# Patient Record
Sex: Male | Born: 1955 | Race: White | Hispanic: No | Marital: Married | Smoking: Never smoker
Health system: Southern US, Community
[De-identification: ages and names within clinical notes are randomized; demographics above are authoritative.]

## PROBLEM LIST (undated history)

## (undated) DIAGNOSIS — F419 Anxiety disorder, unspecified: Secondary | ICD-10-CM

## (undated) HISTORY — PX: HERNIA REPAIR: SHX51

## (undated) HISTORY — PX: COLONOSCOPY: SHX174

## (undated) HISTORY — PX: TONSILLECTOMY: SUR1361

---

## 2003-07-30 ENCOUNTER — Emergency Department (HOSPITAL_COMMUNITY): Admission: EM | Admit: 2003-07-30 | Discharge: 2003-07-30 | Payer: Self-pay | Admitting: Emergency Medicine
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
## 2014-07-17 ENCOUNTER — Encounter (HOSPITAL_COMMUNITY): Payer: Self-pay | Admitting: *Deleted

## 2014-07-17 ENCOUNTER — Ambulatory Visit (HOSPITAL_COMMUNITY)
Admission: EM | Admit: 2014-07-17 | Discharge: 2014-07-17 | Disposition: A | Discharge status: Home / Self Care | Payer: BLUE CROSS/BLUE SHIELD | Source: Ambulatory Visit | Attending: Gastroenterology | Admitting: Gastroenterology

## 2014-07-17 ENCOUNTER — Encounter (HOSPITAL_COMMUNITY)
Admission: EM | Disposition: A | Discharge status: Home / Self Care | Payer: Self-pay | Source: Ambulatory Visit | Attending: Gastroenterology

## 2014-07-17 ENCOUNTER — Other Ambulatory Visit: Payer: Self-pay | Admitting: Gastroenterology

## 2014-07-17 DIAGNOSIS — F419 Anxiety disorder, unspecified: Secondary | ICD-10-CM | POA: Diagnosis not present

## 2014-07-17 DIAGNOSIS — Z1211 Encounter for screening for malignant neoplasm of colon: Secondary | ICD-10-CM | POA: Insufficient documentation

## 2014-07-17 HISTORY — PX: COLONOSCOPY: SHX5424

## 2014-07-17 HISTORY — DX: Anxiety disorder, unspecified: F41.9

## 2014-07-17 SURGERY — COLONOSCOPY
Anesthesia: Moderate Sedation

## 2014-07-17 MED ORDER — DIPHENHYDRAMINE HCL 50 MG/ML IJ SOLN
INTRAMUSCULAR | Status: AC
  Filled 2014-07-17: qty 1

## 2014-07-17 MED ORDER — SODIUM CHLORIDE 0.9 % IV SOLN
INTRAVENOUS | Status: DC
  Administered 2014-07-17: 500 mL via INTRAVENOUS

## 2014-07-17 MED ORDER — FENTANYL CITRATE 0.05 MG/ML IJ SOLN
INTRAMUSCULAR | Status: AC
  Filled 2014-07-17: qty 2

## 2014-07-17 MED ORDER — MIDAZOLAM HCL 5 MG/ML IJ SOLN
INTRAMUSCULAR | Status: AC
  Filled 2014-07-17: qty 2

## 2014-07-17 MED ORDER — FENTANYL CITRATE 0.05 MG/ML IJ SOLN
INTRAMUSCULAR | Status: DC | PRN
  Administered 2014-07-17 (×2): 25 ug via INTRAVENOUS

## 2014-07-17 MED ORDER — MIDAZOLAM HCL 5 MG/5ML IJ SOLN
INTRAMUSCULAR | Status: DC | PRN
  Administered 2014-07-17 (×2): 2 mg via INTRAVENOUS
  Administered 2014-07-17: 1 mg via INTRAVENOUS

## 2014-07-17 NOTE — Op Note (Signed)
[facility] [address] [place], [id number]   COLONOSCOPY PROCEDURE REPORT  PATIENT: Tanner Porter, [name]  MR#: [id number] BIRTHDATE: [birth year] , 58  yrs. old GENDER: male ENDOSCOPIST: Jeani HawkingPatrick Pace Lamadrid, MD REFERRED BY: PROCEDURE DATE:  07/17/2014 PROCEDURE:   Screening ASA CLASS:   1 INDICATIONS:Screening MEDICATIONS: Fentanyl 50 mcg IV and Versed 5 mg IV  DESCRIPTION OF PROCEDURE:   After the risks and benefits and of the procedure were explained, informed consent was obtained.  revealed no abnormalities of the rectum.    The Pentax Ped Colon [id number] endoscope was introduced through the anus and advanced to the cecum, which was identified by both the appendix and ileocecal valve .  The quality of the prep was good. .  The instrument was then slowly withdrawn as the colon was fully examined. Estimated blood loss is zero unless otherwise noted in this procedure report.    FINDINGS: A thin film of mucus covered the majority of the colon. Extensive washing improved the prep to a good rating.  A normal appearing cecum, ileocecal valve, and appendiceal orifice were identified.  The ascending, transverse, descending, sigmoid colon, and rectum appeared unremarkable.  No evidence of any polyps, masses, inflammation, ulcerations, erosions, or vascular abnormalities.     Retroflexed views revealed no abnormalities. The scope was then withdrawn from the patient and the procedure completed.  WITHDRAWAL TIME: 27 minutes  COMPLICATIONS: There were no immediate complications. ENDOSCOPIC IMPRESSION: 1) Normal colonoscopy. RECOMMENDATIONS: 1) Repeat the colonoscopy in 10 years.  REPEAT EXAM:  cc:  _______________________________ eSignedJeani Hawking:  Anetra Czerwinski, MD 07/17/2014 11:03 AM   CPT CODES: ICD CODES:  The ICD and CPT codes recommended by this software are interpretations from the data that the clinical staff has captured with the software.  The  verification of the translation of this report to the ICD and CPT codes and modifiers is the sole responsibility of the health care institution and practicing physician where this report was generated.  [employer] will not be held responsible for the validity of the ICD and CPT codes included on this report.  AMA assumes no liability for data contained or not contained herein. CPT is a Publishing rights managerregistered trademark of the [employer].

## 2014-07-17 NOTE — Discharge Instructions (Signed)
Colonoscopy, Care After °These instructions give you information on caring for yourself after your procedure. Your doctor may also give you more specific instructions. Call your doctor if you have any problems or questions after your procedure. °HOME CARE °· Do not drive for 24 hours. °· Do not sign important papers or use machinery for 24 hours. °· You may shower. °· You may go back to your usual activities, but go slower for the first 24 hours. °· Take rest breaks often during the first 24 hours. °· Walk around or use warm packs on your belly (abdomen) if you have belly cramping or gas. °· Drink enough fluids to keep your pee (urine) clear or pale yellow. °· Resume your normal diet. Avoid heavy or fried foods. °· Avoid drinking alcohol for 24 hours or as told by your doctor. °· Only take medicines as told by your doctor. °If a tissue sample (biopsy) was taken during the procedure:  °· Do not take aspirin or blood thinners for 7 days, or as told by your doctor. °· Do not drink alcohol for 7 days, or as told by your doctor. °· Eat soft foods for the first 24 hours. °GET HELP IF: °You still have a small amount of blood in your poop (stool) 2-3 days after the procedure. °GET HELP RIGHT AWAY IF: °· You have more than a small amount of blood in your poop. °· You see clumps of tissue (blood clots) in your poop. °· Your belly is puffy (swollen). °· You feel sick to your stomach (nauseous) or throw up (vomit). °· You have a fever. °· You have belly pain that gets worse and medicine does not help. °MAKE SURE YOU: °· Understand these instructions. °· Will watch your condition. °· Will get help right away if you are not doing well or get worse. °Document Released: 05/21/2010 Document Revised: 04/23/2013 Document Reviewed: 12/24/2012 °Exit[employer]® Patient Information ©2015 [employer]. This information is not intended to replace advice given to you by your health care provider. Make sure you discuss any questions you have with  your health care provider. ° °

## 2014-07-17 NOTE — H&P (Signed)
  Tanner Porter HPI: At this time the patient denies any problems with nausea, vomiting, fevers, chills, abdominal pain, diarrhea, constipation, hematochezia, melena, GERD, or dysphagia. The patient denies any known family history of colon cancers. No complaints of chest pain, SOB, MI, or sleep apnea. On 07/29/2004 he had his index colonoscopy which was negative. He was recommended to have a follow up procedure in 10 years   Past Medical History  Diagnosis Date  . Anxiety     Past Surgical History  Procedure Laterality Date  . Colonoscopy  10 yrs ago  . Tonsillectomy      as a child  . Hernia repair      as a child    History reviewed. No pertinent family history.  Social History:  reports that he has never smoked. He does not have any smokeless tobacco history on file. He reports that he drinks alcohol. He reports that he does not use illicit drugs.  Allergies: Not on File  Medications:  Scheduled:  Continuous: . sodium chloride      No results found for this or any previous visit (from the past 24 hour(s)).   No results found.  ROS:  As stated above in the HPI otherwise negative.  Blood pressure 134/85, temperature 97.7 F (36.5 C), temperature source Oral, resp. rate 12, height 5\' 11"  (1.803 m), weight 68.947 kg (152 lb), SpO2 99 %.    PE: Gen: NAD, Alert and Oriented HEENT:  [place]/AT, EOMI Neck: Supple, no LAD Lungs: CTA Bilaterally CV: RRR without M/G/R ABM: Soft, NTND, +BS Ext: No C/C/E  Assessment/Plan: 1) Screening colonoscopy.  Shayley Medlin D 07/17/2014, 9:26 AM

## 2014-07-18 ENCOUNTER — Encounter (HOSPITAL_COMMUNITY): Payer: Self-pay | Admitting: Gastroenterology

## 2016-06-20 DIAGNOSIS — H1131 Conjunctival hemorrhage, right eye: Secondary | ICD-10-CM | POA: Diagnosis not present

## 2016-07-18 DIAGNOSIS — L570 Actinic keratosis: Secondary | ICD-10-CM | POA: Diagnosis not present

## 2016-07-18 DIAGNOSIS — L821 Other seborrheic keratosis: Secondary | ICD-10-CM | POA: Diagnosis not present

## 2016-07-18 DIAGNOSIS — L814 Other melanin hyperpigmentation: Secondary | ICD-10-CM | POA: Diagnosis not present

## 2016-07-18 DIAGNOSIS — Z85828 Personal history of other malignant neoplasm of skin: Secondary | ICD-10-CM | POA: Diagnosis not present

## 2016-07-18 DIAGNOSIS — L111 Transient acantholytic dermatosis [Grover]: Secondary | ICD-10-CM | POA: Diagnosis not present

## 2016-12-19 DIAGNOSIS — S43421A Sprain of right rotator cuff capsule, initial encounter: Secondary | ICD-10-CM | POA: Diagnosis not present

## 2016-12-19 DIAGNOSIS — M9907 Segmental and somatic dysfunction of upper extremity: Secondary | ICD-10-CM | POA: Diagnosis not present

## 2016-12-19 DIAGNOSIS — M9901 Segmental and somatic dysfunction of cervical region: Secondary | ICD-10-CM | POA: Diagnosis not present

## 2016-12-19 DIAGNOSIS — M531 Cervicobrachial syndrome: Secondary | ICD-10-CM | POA: Diagnosis not present

## 2016-12-21 DIAGNOSIS — M9907 Segmental and somatic dysfunction of upper extremity: Secondary | ICD-10-CM | POA: Diagnosis not present

## 2016-12-21 DIAGNOSIS — S43421A Sprain of right rotator cuff capsule, initial encounter: Secondary | ICD-10-CM | POA: Diagnosis not present

## 2016-12-21 DIAGNOSIS — M531 Cervicobrachial syndrome: Secondary | ICD-10-CM | POA: Diagnosis not present

## 2016-12-21 DIAGNOSIS — M9901 Segmental and somatic dysfunction of cervical region: Secondary | ICD-10-CM | POA: Diagnosis not present

## 2016-12-26 DIAGNOSIS — M531 Cervicobrachial syndrome: Secondary | ICD-10-CM | POA: Diagnosis not present

## 2016-12-26 DIAGNOSIS — S43421A Sprain of right rotator cuff capsule, initial encounter: Secondary | ICD-10-CM | POA: Diagnosis not present

## 2016-12-26 DIAGNOSIS — M9901 Segmental and somatic dysfunction of cervical region: Secondary | ICD-10-CM | POA: Diagnosis not present

## 2016-12-26 DIAGNOSIS — M9907 Segmental and somatic dysfunction of upper extremity: Secondary | ICD-10-CM | POA: Diagnosis not present

## 2016-12-28 DIAGNOSIS — M9907 Segmental and somatic dysfunction of upper extremity: Secondary | ICD-10-CM | POA: Diagnosis not present

## 2016-12-28 DIAGNOSIS — M531 Cervicobrachial syndrome: Secondary | ICD-10-CM | POA: Diagnosis not present

## 2016-12-28 DIAGNOSIS — M9901 Segmental and somatic dysfunction of cervical region: Secondary | ICD-10-CM | POA: Diagnosis not present

## 2016-12-28 DIAGNOSIS — S43421A Sprain of right rotator cuff capsule, initial encounter: Secondary | ICD-10-CM | POA: Diagnosis not present

## 2017-01-09 DIAGNOSIS — S43421A Sprain of right rotator cuff capsule, initial encounter: Secondary | ICD-10-CM | POA: Diagnosis not present

## 2017-01-09 DIAGNOSIS — M9901 Segmental and somatic dysfunction of cervical region: Secondary | ICD-10-CM | POA: Diagnosis not present

## 2017-01-09 DIAGNOSIS — M531 Cervicobrachial syndrome: Secondary | ICD-10-CM | POA: Diagnosis not present

## 2017-01-09 DIAGNOSIS — M9907 Segmental and somatic dysfunction of upper extremity: Secondary | ICD-10-CM | POA: Diagnosis not present

## 2017-01-11 DIAGNOSIS — M531 Cervicobrachial syndrome: Secondary | ICD-10-CM | POA: Diagnosis not present

## 2017-01-11 DIAGNOSIS — S43421A Sprain of right rotator cuff capsule, initial encounter: Secondary | ICD-10-CM | POA: Diagnosis not present

## 2017-01-11 DIAGNOSIS — M9901 Segmental and somatic dysfunction of cervical region: Secondary | ICD-10-CM | POA: Diagnosis not present

## 2017-01-11 DIAGNOSIS — M9907 Segmental and somatic dysfunction of upper extremity: Secondary | ICD-10-CM | POA: Diagnosis not present

## 2017-01-23 DIAGNOSIS — M9907 Segmental and somatic dysfunction of upper extremity: Secondary | ICD-10-CM | POA: Diagnosis not present

## 2017-01-23 DIAGNOSIS — M9901 Segmental and somatic dysfunction of cervical region: Secondary | ICD-10-CM | POA: Diagnosis not present

## 2017-01-23 DIAGNOSIS — M531 Cervicobrachial syndrome: Secondary | ICD-10-CM | POA: Diagnosis not present

## 2017-01-23 DIAGNOSIS — S43421A Sprain of right rotator cuff capsule, initial encounter: Secondary | ICD-10-CM | POA: Diagnosis not present

## 2017-01-25 DIAGNOSIS — S43421A Sprain of right rotator cuff capsule, initial encounter: Secondary | ICD-10-CM | POA: Diagnosis not present

## 2017-01-25 DIAGNOSIS — M531 Cervicobrachial syndrome: Secondary | ICD-10-CM | POA: Diagnosis not present

## 2017-01-25 DIAGNOSIS — M9901 Segmental and somatic dysfunction of cervical region: Secondary | ICD-10-CM | POA: Diagnosis not present

## 2017-01-25 DIAGNOSIS — M9907 Segmental and somatic dysfunction of upper extremity: Secondary | ICD-10-CM | POA: Diagnosis not present

## 2017-01-30 DIAGNOSIS — S43421A Sprain of right rotator cuff capsule, initial encounter: Secondary | ICD-10-CM | POA: Diagnosis not present

## 2017-01-30 DIAGNOSIS — M9907 Segmental and somatic dysfunction of upper extremity: Secondary | ICD-10-CM | POA: Diagnosis not present

## 2017-01-30 DIAGNOSIS — M9901 Segmental and somatic dysfunction of cervical region: Secondary | ICD-10-CM | POA: Diagnosis not present

## 2017-01-30 DIAGNOSIS — M531 Cervicobrachial syndrome: Secondary | ICD-10-CM | POA: Diagnosis not present

## 2017-02-01 DIAGNOSIS — M9907 Segmental and somatic dysfunction of upper extremity: Secondary | ICD-10-CM | POA: Diagnosis not present

## 2017-02-01 DIAGNOSIS — M531 Cervicobrachial syndrome: Secondary | ICD-10-CM | POA: Diagnosis not present

## 2017-02-01 DIAGNOSIS — M9901 Segmental and somatic dysfunction of cervical region: Secondary | ICD-10-CM | POA: Diagnosis not present

## 2017-02-01 DIAGNOSIS — S43421A Sprain of right rotator cuff capsule, initial encounter: Secondary | ICD-10-CM | POA: Diagnosis not present

## 2017-02-07 DIAGNOSIS — M9907 Segmental and somatic dysfunction of upper extremity: Secondary | ICD-10-CM | POA: Diagnosis not present

## 2017-02-07 DIAGNOSIS — S43421A Sprain of right rotator cuff capsule, initial encounter: Secondary | ICD-10-CM | POA: Diagnosis not present

## 2017-02-07 DIAGNOSIS — M9901 Segmental and somatic dysfunction of cervical region: Secondary | ICD-10-CM | POA: Diagnosis not present

## 2017-02-07 DIAGNOSIS — M531 Cervicobrachial syndrome: Secondary | ICD-10-CM | POA: Diagnosis not present

## 2017-02-09 DIAGNOSIS — M9901 Segmental and somatic dysfunction of cervical region: Secondary | ICD-10-CM | POA: Diagnosis not present

## 2017-02-09 DIAGNOSIS — S43421A Sprain of right rotator cuff capsule, initial encounter: Secondary | ICD-10-CM | POA: Diagnosis not present

## 2017-02-09 DIAGNOSIS — M531 Cervicobrachial syndrome: Secondary | ICD-10-CM | POA: Diagnosis not present

## 2017-02-09 DIAGNOSIS — M9907 Segmental and somatic dysfunction of upper extremity: Secondary | ICD-10-CM | POA: Diagnosis not present

## 2017-02-13 DIAGNOSIS — S43421A Sprain of right rotator cuff capsule, initial encounter: Secondary | ICD-10-CM | POA: Diagnosis not present

## 2017-02-13 DIAGNOSIS — M531 Cervicobrachial syndrome: Secondary | ICD-10-CM | POA: Diagnosis not present

## 2017-02-13 DIAGNOSIS — M9907 Segmental and somatic dysfunction of upper extremity: Secondary | ICD-10-CM | POA: Diagnosis not present

## 2017-02-13 DIAGNOSIS — M9901 Segmental and somatic dysfunction of cervical region: Secondary | ICD-10-CM | POA: Diagnosis not present

## 2017-03-13 DIAGNOSIS — Z125 Encounter for screening for malignant neoplasm of prostate: Secondary | ICD-10-CM | POA: Diagnosis not present

## 2017-03-13 DIAGNOSIS — Z0000 Encounter for general adult medical examination without abnormal findings: Secondary | ICD-10-CM | POA: Diagnosis not present

## 2017-03-13 DIAGNOSIS — E785 Hyperlipidemia, unspecified: Secondary | ICD-10-CM | POA: Diagnosis not present

## 2017-03-15 DIAGNOSIS — H5213 Myopia, bilateral: Secondary | ICD-10-CM | POA: Diagnosis not present

## 2017-03-15 DIAGNOSIS — H52203 Unspecified astigmatism, bilateral: Secondary | ICD-10-CM | POA: Diagnosis not present

## 2017-03-15 DIAGNOSIS — H524 Presbyopia: Secondary | ICD-10-CM | POA: Diagnosis not present

## 2017-03-20 DIAGNOSIS — Z0000 Encounter for general adult medical examination without abnormal findings: Secondary | ICD-10-CM | POA: Diagnosis not present

## 2017-03-20 DIAGNOSIS — Z1212 Encounter for screening for malignant neoplasm of rectum: Secondary | ICD-10-CM | POA: Diagnosis not present

## 2017-06-21 DIAGNOSIS — L821 Other seborrheic keratosis: Secondary | ICD-10-CM | POA: Diagnosis not present

## 2017-06-21 DIAGNOSIS — D2261 Melanocytic nevi of right upper limb, including shoulder: Secondary | ICD-10-CM | POA: Diagnosis not present

## 2017-06-21 DIAGNOSIS — L570 Actinic keratosis: Secondary | ICD-10-CM | POA: Diagnosis not present

## 2017-06-21 DIAGNOSIS — D225 Melanocytic nevi of trunk: Secondary | ICD-10-CM | POA: Diagnosis not present

## 2017-06-21 DIAGNOSIS — Z85828 Personal history of other malignant neoplasm of skin: Secondary | ICD-10-CM | POA: Diagnosis not present

## 2018-03-21 DIAGNOSIS — H5213 Myopia, bilateral: Secondary | ICD-10-CM | POA: Diagnosis not present

## 2018-03-21 DIAGNOSIS — H52203 Unspecified astigmatism, bilateral: Secondary | ICD-10-CM | POA: Diagnosis not present

## 2018-03-28 DIAGNOSIS — Z1159 Encounter for screening for other viral diseases: Secondary | ICD-10-CM | POA: Diagnosis not present

## 2018-03-28 DIAGNOSIS — Z125 Encounter for screening for malignant neoplasm of prostate: Secondary | ICD-10-CM | POA: Diagnosis not present

## 2018-03-28 DIAGNOSIS — Z0000 Encounter for general adult medical examination without abnormal findings: Secondary | ICD-10-CM | POA: Diagnosis not present

## 2018-04-02 DIAGNOSIS — Z23 Encounter for immunization: Secondary | ICD-10-CM | POA: Diagnosis not present

## 2018-04-02 DIAGNOSIS — F329 Major depressive disorder, single episode, unspecified: Secondary | ICD-10-CM | POA: Diagnosis not present

## 2018-04-02 DIAGNOSIS — Z1212 Encounter for screening for malignant neoplasm of rectum: Secondary | ICD-10-CM | POA: Diagnosis not present

## 2018-04-02 DIAGNOSIS — Z0000 Encounter for general adult medical examination without abnormal findings: Secondary | ICD-10-CM | POA: Diagnosis not present

## 2018-04-02 DIAGNOSIS — J309 Allergic rhinitis, unspecified: Secondary | ICD-10-CM | POA: Diagnosis not present

## 2018-04-02 DIAGNOSIS — N529 Male erectile dysfunction, unspecified: Secondary | ICD-10-CM | POA: Diagnosis not present

## 2018-06-21 DIAGNOSIS — L570 Actinic keratosis: Secondary | ICD-10-CM | POA: Diagnosis not present

## 2018-06-21 DIAGNOSIS — L821 Other seborrheic keratosis: Secondary | ICD-10-CM | POA: Diagnosis not present

## 2018-06-21 DIAGNOSIS — Z85828 Personal history of other malignant neoplasm of skin: Secondary | ICD-10-CM | POA: Diagnosis not present

## 2018-06-21 DIAGNOSIS — D224 Melanocytic nevi of scalp and neck: Secondary | ICD-10-CM | POA: Diagnosis not present

## 2019-04-01 DIAGNOSIS — H2513 Age-related nuclear cataract, bilateral: Secondary | ICD-10-CM | POA: Diagnosis not present

## 2019-04-01 DIAGNOSIS — H524 Presbyopia: Secondary | ICD-10-CM | POA: Diagnosis not present

## 2019-04-04 DIAGNOSIS — Z125 Encounter for screening for malignant neoplasm of prostate: Secondary | ICD-10-CM | POA: Diagnosis not present

## 2019-04-04 DIAGNOSIS — E785 Hyperlipidemia, unspecified: Secondary | ICD-10-CM | POA: Diagnosis not present

## 2019-04-09 DIAGNOSIS — E785 Hyperlipidemia, unspecified: Secondary | ICD-10-CM | POA: Diagnosis not present

## 2019-04-09 DIAGNOSIS — J309 Allergic rhinitis, unspecified: Secondary | ICD-10-CM | POA: Diagnosis not present

## 2019-04-09 DIAGNOSIS — Z23 Encounter for immunization: Secondary | ICD-10-CM | POA: Diagnosis not present

## 2019-04-09 DIAGNOSIS — N529 Male erectile dysfunction, unspecified: Secondary | ICD-10-CM | POA: Diagnosis not present

## 2019-04-09 DIAGNOSIS — Z0001 Encounter for general adult medical examination with abnormal findings: Secondary | ICD-10-CM | POA: Diagnosis not present

## 2019-04-09 DIAGNOSIS — Z1212 Encounter for screening for malignant neoplasm of rectum: Secondary | ICD-10-CM | POA: Diagnosis not present

## 2019-06-23 ENCOUNTER — Ambulatory Visit: Payer: BLUE CROSS/BLUE SHIELD | Attending: Internal Medicine

## 2019-06-23 DIAGNOSIS — Z23 Encounter for immunization: Secondary | ICD-10-CM | POA: Insufficient documentation

## 2019-06-23 NOTE — Progress Notes (Signed)
   Covid-19 Vaccination Clinic  Name:  Creedon Danielski    MRN: [id number] DOB: 1955-05-30  06/23/2019  Mr. Farrington was observed post Covid-19 immunization for 15 minutes without incidence. He was provided with Vaccine Information Sheet and instruction to access the V-Safe system.   Mr. Gramling was instructed to call 911 with any severe reactions post vaccine: Marland Kitchen Difficulty breathing  . Swelling of your face and throat  . A fast heartbeat  . A bad rash all over your body  . Dizziness and weakness    Immunizations Administered    Name Date Dose VIS Date Route   Pfizer COVID-19 Vaccine 06/23/2019 10:31 AM 0.3 mL 04/12/2019 Intramuscular   Manufacturer: [employer], [employer]   Lot: [id number]   NDC: [address]-9

## 2019-07-04 DIAGNOSIS — L821 Other seborrheic keratosis: Secondary | ICD-10-CM | POA: Diagnosis not present

## 2019-07-04 DIAGNOSIS — C44619 Basal cell carcinoma of skin of left upper limb, including shoulder: Secondary | ICD-10-CM | POA: Diagnosis not present

## 2019-07-04 DIAGNOSIS — Z85828 Personal history of other malignant neoplasm of skin: Secondary | ICD-10-CM | POA: Diagnosis not present

## 2019-07-04 DIAGNOSIS — L570 Actinic keratosis: Secondary | ICD-10-CM | POA: Diagnosis not present

## 2019-07-04 DIAGNOSIS — L814 Other melanin hyperpigmentation: Secondary | ICD-10-CM | POA: Diagnosis not present

## 2019-07-17 ENCOUNTER — Ambulatory Visit: Payer: BLUE CROSS/BLUE SHIELD | Attending: Internal Medicine

## 2019-07-17 DIAGNOSIS — Z23 Encounter for immunization: Secondary | ICD-10-CM

## 2019-07-17 NOTE — Progress Notes (Signed)
   Covid-19 Vaccination Clinic  Name:  Tanner Porter    MRN: [id number] DOB: November 08, 1955  07/17/2019  Tanner Porter was observed post Covid-19 immunization for 15 minutes without incident. He was provided with Vaccine Information Sheet and instruction to access the V-Safe system.   Tanner Porter was instructed to call 911 with any severe reactions post vaccine: Marland Kitchen Difficulty breathing  . Swelling of face and throat  . A fast heartbeat  . A bad rash all over body  . Dizziness and weakness   Immunizations Administered    Name Date Dose VIS Date Route   Pfizer COVID-19 Vaccine 07/17/2019  9:04 AM 0.3 mL 04/12/2019 Intramuscular   Manufacturer: [employer], [employer]   Lot: [id number]   NDC: [address]-1

## 2020-03-31 DIAGNOSIS — H04123 Dry eye syndrome of bilateral lacrimal glands: Secondary | ICD-10-CM | POA: Diagnosis not present

## 2020-03-31 DIAGNOSIS — H5213 Myopia, bilateral: Secondary | ICD-10-CM | POA: Diagnosis not present

## 2020-03-31 DIAGNOSIS — H2513 Age-related nuclear cataract, bilateral: Secondary | ICD-10-CM | POA: Diagnosis not present

## 2020-04-07 DIAGNOSIS — Z125 Encounter for screening for malignant neoplasm of prostate: Secondary | ICD-10-CM | POA: Diagnosis not present

## 2020-04-07 DIAGNOSIS — E785 Hyperlipidemia, unspecified: Secondary | ICD-10-CM | POA: Diagnosis not present

## 2020-04-07 DIAGNOSIS — Z0000 Encounter for general adult medical examination without abnormal findings: Secondary | ICD-10-CM | POA: Diagnosis not present

## 2020-04-08 DIAGNOSIS — E875 Hyperkalemia: Secondary | ICD-10-CM | POA: Diagnosis not present

## 2020-04-14 DIAGNOSIS — N529 Male erectile dysfunction, unspecified: Secondary | ICD-10-CM | POA: Diagnosis not present

## 2020-04-14 DIAGNOSIS — E785 Hyperlipidemia, unspecified: Secondary | ICD-10-CM | POA: Diagnosis not present

## 2020-04-14 DIAGNOSIS — Z0000 Encounter for general adult medical examination without abnormal findings: Secondary | ICD-10-CM | POA: Diagnosis not present

## 2020-04-14 DIAGNOSIS — S81811A Laceration without foreign body, right lower leg, initial encounter: Secondary | ICD-10-CM | POA: Diagnosis not present

## 2020-04-14 DIAGNOSIS — J309 Allergic rhinitis, unspecified: Secondary | ICD-10-CM | POA: Diagnosis not present

## 2020-04-14 DIAGNOSIS — Z23 Encounter for immunization: Secondary | ICD-10-CM | POA: Diagnosis not present

## 2020-04-14 DIAGNOSIS — F329 Major depressive disorder, single episode, unspecified: Secondary | ICD-10-CM | POA: Diagnosis not present

## 2020-07-06 DIAGNOSIS — D485 Neoplasm of uncertain behavior of skin: Secondary | ICD-10-CM | POA: Diagnosis not present

## 2020-07-06 DIAGNOSIS — L814 Other melanin hyperpigmentation: Secondary | ICD-10-CM | POA: Diagnosis not present

## 2020-07-06 DIAGNOSIS — D225 Melanocytic nevi of trunk: Secondary | ICD-10-CM | POA: Diagnosis not present

## 2020-07-06 DIAGNOSIS — L570 Actinic keratosis: Secondary | ICD-10-CM | POA: Diagnosis not present

## 2020-07-06 DIAGNOSIS — Z85828 Personal history of other malignant neoplasm of skin: Secondary | ICD-10-CM | POA: Diagnosis not present

## 2020-12-09 ENCOUNTER — Other Ambulatory Visit (HOSPITAL_BASED_OUTPATIENT_CLINIC_OR_DEPARTMENT_OTHER): Payer: Self-pay

## 2020-12-09 ENCOUNTER — Ambulatory Visit: Payer: BLUE CROSS/BLUE SHIELD | Attending: Internal Medicine

## 2020-12-09 ENCOUNTER — Other Ambulatory Visit: Payer: Self-pay

## 2020-12-09 DIAGNOSIS — Z23 Encounter for immunization: Secondary | ICD-10-CM

## 2020-12-09 MED ORDER — PFIZER-BIONT COVID-19 VAC-TRIS 30 MCG/0.3ML IM SUSP
INTRAMUSCULAR | 0 refills | Status: AC
  Filled 2020-12-09: qty 0.3, 1d supply, fill #0

## 2020-12-09 NOTE — Progress Notes (Signed)
   Covid-19 Vaccination Clinic  Name:  Tanner Porter    MRN: [id number] DOB: 15-Jan-1956  12/09/2020  [name] was observed post Covid-19 immunization for 15 minutes without incident. He was provided with Vaccine Information Sheet and instruction to access the V-Safe system.   [name] was instructed to call 911 with any severe reactions post vaccine: Difficulty breathing  Swelling of face and throat  A fast heartbeat  A bad rash all over body  Dizziness and weakness   Immunizations Administered     Name Date Dose VIS Date Route   PFIZER Comrnaty(Gray TOP) Covid-19 Vaccine 12/09/2020  9:21 AM 0.3 mL 04/09/2020 Intramuscular   Manufacturer: [employer], [employer]   Lot: [id number]   NDC: [phone]

## 2021-01-21 DIAGNOSIS — Z23 Encounter for immunization: Secondary | ICD-10-CM | POA: Diagnosis not present

## 2021-02-17 DIAGNOSIS — Z20822 Contact with and (suspected) exposure to covid-19: Secondary | ICD-10-CM | POA: Diagnosis not present

## 2021-04-06 DIAGNOSIS — H5213 Myopia, bilateral: Secondary | ICD-10-CM | POA: Diagnosis not present

## 2021-04-06 DIAGNOSIS — H2513 Age-related nuclear cataract, bilateral: Secondary | ICD-10-CM | POA: Diagnosis not present

## 2021-04-16 DIAGNOSIS — Z125 Encounter for screening for malignant neoplasm of prostate: Secondary | ICD-10-CM | POA: Diagnosis not present

## 2021-04-16 DIAGNOSIS — Z0000 Encounter for general adult medical examination without abnormal findings: Secondary | ICD-10-CM | POA: Diagnosis not present

## 2021-04-20 ENCOUNTER — Other Ambulatory Visit: Payer: Self-pay | Admitting: Internal Medicine

## 2021-04-20 DIAGNOSIS — J309 Allergic rhinitis, unspecified: Secondary | ICD-10-CM | POA: Diagnosis not present

## 2021-04-20 DIAGNOSIS — F329 Major depressive disorder, single episode, unspecified: Secondary | ICD-10-CM | POA: Diagnosis not present

## 2021-04-20 DIAGNOSIS — Z0000 Encounter for general adult medical examination without abnormal findings: Secondary | ICD-10-CM | POA: Diagnosis not present

## 2021-04-20 DIAGNOSIS — N529 Male erectile dysfunction, unspecified: Secondary | ICD-10-CM | POA: Diagnosis not present

## 2021-04-20 DIAGNOSIS — Z1212 Encounter for screening for malignant neoplasm of rectum: Secondary | ICD-10-CM | POA: Diagnosis not present

## 2021-04-20 DIAGNOSIS — E785 Hyperlipidemia, unspecified: Secondary | ICD-10-CM

## 2021-05-21 ENCOUNTER — Ambulatory Visit
Admission: RE | Admit: 2021-05-21 | Discharge: 2021-05-21 | Disposition: A | Discharge status: Home / Self Care | Payer: No Typology Code available for payment source | Source: Ambulatory Visit | Attending: Internal Medicine | Admitting: Internal Medicine

## 2021-05-21 DIAGNOSIS — E785 Hyperlipidemia, unspecified: Secondary | ICD-10-CM

## 2021-06-03 DIAGNOSIS — I2510 Atherosclerotic heart disease of native coronary artery without angina pectoris: Secondary | ICD-10-CM | POA: Diagnosis not present

## 2021-06-03 DIAGNOSIS — I700 Atherosclerosis of aorta: Secondary | ICD-10-CM | POA: Diagnosis not present

## 2021-07-05 DIAGNOSIS — M9907 Segmental and somatic dysfunction of upper extremity: Secondary | ICD-10-CM | POA: Diagnosis not present

## 2021-07-05 DIAGNOSIS — M5412 Radiculopathy, cervical region: Secondary | ICD-10-CM | POA: Diagnosis not present

## 2021-07-05 DIAGNOSIS — S43422A Sprain of left rotator cuff capsule, initial encounter: Secondary | ICD-10-CM | POA: Diagnosis not present

## 2021-07-05 DIAGNOSIS — M9901 Segmental and somatic dysfunction of cervical region: Secondary | ICD-10-CM | POA: Diagnosis not present

## 2021-07-06 DIAGNOSIS — S43422A Sprain of left rotator cuff capsule, initial encounter: Secondary | ICD-10-CM | POA: Diagnosis not present

## 2021-07-06 DIAGNOSIS — M9907 Segmental and somatic dysfunction of upper extremity: Secondary | ICD-10-CM | POA: Diagnosis not present

## 2021-07-06 DIAGNOSIS — M5412 Radiculopathy, cervical region: Secondary | ICD-10-CM | POA: Diagnosis not present

## 2021-07-06 DIAGNOSIS — M9901 Segmental and somatic dysfunction of cervical region: Secondary | ICD-10-CM | POA: Diagnosis not present

## 2021-07-07 DIAGNOSIS — D1801 Hemangioma of skin and subcutaneous tissue: Secondary | ICD-10-CM | POA: Diagnosis not present

## 2021-07-07 DIAGNOSIS — L570 Actinic keratosis: Secondary | ICD-10-CM | POA: Diagnosis not present

## 2021-07-07 DIAGNOSIS — C44319 Basal cell carcinoma of skin of other parts of face: Secondary | ICD-10-CM | POA: Diagnosis not present

## 2021-07-07 DIAGNOSIS — Z85828 Personal history of other malignant neoplasm of skin: Secondary | ICD-10-CM | POA: Diagnosis not present

## 2021-07-07 DIAGNOSIS — L821 Other seborrheic keratosis: Secondary | ICD-10-CM | POA: Diagnosis not present

## 2021-07-08 DIAGNOSIS — M9901 Segmental and somatic dysfunction of cervical region: Secondary | ICD-10-CM | POA: Diagnosis not present

## 2021-07-08 DIAGNOSIS — M9907 Segmental and somatic dysfunction of upper extremity: Secondary | ICD-10-CM | POA: Diagnosis not present

## 2021-07-08 DIAGNOSIS — M5412 Radiculopathy, cervical region: Secondary | ICD-10-CM | POA: Diagnosis not present

## 2021-07-08 DIAGNOSIS — S43422A Sprain of left rotator cuff capsule, initial encounter: Secondary | ICD-10-CM | POA: Diagnosis not present

## 2021-07-12 DIAGNOSIS — S43422A Sprain of left rotator cuff capsule, initial encounter: Secondary | ICD-10-CM | POA: Diagnosis not present

## 2021-07-12 DIAGNOSIS — M9907 Segmental and somatic dysfunction of upper extremity: Secondary | ICD-10-CM | POA: Diagnosis not present

## 2021-07-12 DIAGNOSIS — M5412 Radiculopathy, cervical region: Secondary | ICD-10-CM | POA: Diagnosis not present

## 2021-07-12 DIAGNOSIS — M9901 Segmental and somatic dysfunction of cervical region: Secondary | ICD-10-CM | POA: Diagnosis not present

## 2021-07-14 DIAGNOSIS — M9907 Segmental and somatic dysfunction of upper extremity: Secondary | ICD-10-CM | POA: Diagnosis not present

## 2021-07-14 DIAGNOSIS — S43422A Sprain of left rotator cuff capsule, initial encounter: Secondary | ICD-10-CM | POA: Diagnosis not present

## 2021-07-14 DIAGNOSIS — M5412 Radiculopathy, cervical region: Secondary | ICD-10-CM | POA: Diagnosis not present

## 2021-07-14 DIAGNOSIS — M9901 Segmental and somatic dysfunction of cervical region: Secondary | ICD-10-CM | POA: Diagnosis not present

## 2021-07-19 DIAGNOSIS — I2510 Atherosclerotic heart disease of native coronary artery without angina pectoris: Secondary | ICD-10-CM | POA: Diagnosis not present

## 2021-07-19 DIAGNOSIS — S43422A Sprain of left rotator cuff capsule, initial encounter: Secondary | ICD-10-CM | POA: Diagnosis not present

## 2021-07-19 DIAGNOSIS — M5412 Radiculopathy, cervical region: Secondary | ICD-10-CM | POA: Diagnosis not present

## 2021-07-19 DIAGNOSIS — M9901 Segmental and somatic dysfunction of cervical region: Secondary | ICD-10-CM | POA: Diagnosis not present

## 2021-07-19 DIAGNOSIS — M9907 Segmental and somatic dysfunction of upper extremity: Secondary | ICD-10-CM | POA: Diagnosis not present

## 2021-07-21 DIAGNOSIS — I700 Atherosclerosis of aorta: Secondary | ICD-10-CM | POA: Diagnosis not present

## 2021-07-21 DIAGNOSIS — I2510 Atherosclerotic heart disease of native coronary artery without angina pectoris: Secondary | ICD-10-CM | POA: Diagnosis not present

## 2021-07-21 DIAGNOSIS — M9907 Segmental and somatic dysfunction of upper extremity: Secondary | ICD-10-CM | POA: Diagnosis not present

## 2021-07-21 DIAGNOSIS — M5412 Radiculopathy, cervical region: Secondary | ICD-10-CM | POA: Diagnosis not present

## 2021-07-21 DIAGNOSIS — S43422A Sprain of left rotator cuff capsule, initial encounter: Secondary | ICD-10-CM | POA: Diagnosis not present

## 2021-07-21 DIAGNOSIS — M9901 Segmental and somatic dysfunction of cervical region: Secondary | ICD-10-CM | POA: Diagnosis not present

## 2021-07-26 DIAGNOSIS — S43422A Sprain of left rotator cuff capsule, initial encounter: Secondary | ICD-10-CM | POA: Diagnosis not present

## 2021-07-26 DIAGNOSIS — M9907 Segmental and somatic dysfunction of upper extremity: Secondary | ICD-10-CM | POA: Diagnosis not present

## 2021-07-26 DIAGNOSIS — M9901 Segmental and somatic dysfunction of cervical region: Secondary | ICD-10-CM | POA: Diagnosis not present

## 2021-07-26 DIAGNOSIS — M5412 Radiculopathy, cervical region: Secondary | ICD-10-CM | POA: Diagnosis not present

## 2021-07-28 DIAGNOSIS — M5412 Radiculopathy, cervical region: Secondary | ICD-10-CM | POA: Diagnosis not present

## 2021-07-28 DIAGNOSIS — M9901 Segmental and somatic dysfunction of cervical region: Secondary | ICD-10-CM | POA: Diagnosis not present

## 2021-07-28 DIAGNOSIS — M9907 Segmental and somatic dysfunction of upper extremity: Secondary | ICD-10-CM | POA: Diagnosis not present

## 2021-07-28 DIAGNOSIS — S43422A Sprain of left rotator cuff capsule, initial encounter: Secondary | ICD-10-CM | POA: Diagnosis not present

## 2021-08-02 DIAGNOSIS — M9901 Segmental and somatic dysfunction of cervical region: Secondary | ICD-10-CM | POA: Diagnosis not present

## 2021-08-02 DIAGNOSIS — S43422A Sprain of left rotator cuff capsule, initial encounter: Secondary | ICD-10-CM | POA: Diagnosis not present

## 2021-08-02 DIAGNOSIS — M5412 Radiculopathy, cervical region: Secondary | ICD-10-CM | POA: Diagnosis not present

## 2021-08-02 DIAGNOSIS — M9907 Segmental and somatic dysfunction of upper extremity: Secondary | ICD-10-CM | POA: Diagnosis not present

## 2021-08-09 DIAGNOSIS — M9907 Segmental and somatic dysfunction of upper extremity: Secondary | ICD-10-CM | POA: Diagnosis not present

## 2021-08-09 DIAGNOSIS — M5412 Radiculopathy, cervical region: Secondary | ICD-10-CM | POA: Diagnosis not present

## 2021-08-09 DIAGNOSIS — M9901 Segmental and somatic dysfunction of cervical region: Secondary | ICD-10-CM | POA: Diagnosis not present

## 2021-08-09 DIAGNOSIS — S43422A Sprain of left rotator cuff capsule, initial encounter: Secondary | ICD-10-CM | POA: Diagnosis not present

## 2021-08-16 DIAGNOSIS — C44319 Basal cell carcinoma of skin of other parts of face: Secondary | ICD-10-CM | POA: Diagnosis not present

## 2021-08-25 DIAGNOSIS — S43422A Sprain of left rotator cuff capsule, initial encounter: Secondary | ICD-10-CM | POA: Diagnosis not present

## 2021-08-25 DIAGNOSIS — M9907 Segmental and somatic dysfunction of upper extremity: Secondary | ICD-10-CM | POA: Diagnosis not present

## 2021-08-25 DIAGNOSIS — M9901 Segmental and somatic dysfunction of cervical region: Secondary | ICD-10-CM | POA: Diagnosis not present

## 2021-08-25 DIAGNOSIS — M5412 Radiculopathy, cervical region: Secondary | ICD-10-CM | POA: Diagnosis not present

## 2021-09-01 DIAGNOSIS — M9907 Segmental and somatic dysfunction of upper extremity: Secondary | ICD-10-CM | POA: Diagnosis not present

## 2021-09-01 DIAGNOSIS — M5412 Radiculopathy, cervical region: Secondary | ICD-10-CM | POA: Diagnosis not present

## 2021-09-01 DIAGNOSIS — M9901 Segmental and somatic dysfunction of cervical region: Secondary | ICD-10-CM | POA: Diagnosis not present

## 2021-09-01 DIAGNOSIS — S43422A Sprain of left rotator cuff capsule, initial encounter: Secondary | ICD-10-CM | POA: Diagnosis not present

## 2021-10-08 DIAGNOSIS — H2513 Age-related nuclear cataract, bilateral: Secondary | ICD-10-CM | POA: Diagnosis not present

## 2021-10-27 DIAGNOSIS — H269 Unspecified cataract: Secondary | ICD-10-CM | POA: Diagnosis not present

## 2021-10-27 DIAGNOSIS — H2511 Age-related nuclear cataract, right eye: Secondary | ICD-10-CM | POA: Diagnosis not present

## 2021-11-17 DIAGNOSIS — H2512 Age-related nuclear cataract, left eye: Secondary | ICD-10-CM | POA: Diagnosis not present

## 2021-12-10 DIAGNOSIS — H02052 Trichiasis without entropian right lower eyelid: Secondary | ICD-10-CM | POA: Diagnosis not present

## 2022-04-19 DIAGNOSIS — Z0000 Encounter for general adult medical examination without abnormal findings: Secondary | ICD-10-CM | POA: Diagnosis not present

## 2022-04-19 DIAGNOSIS — Z125 Encounter for screening for malignant neoplasm of prostate: Secondary | ICD-10-CM | POA: Diagnosis not present

## 2022-04-22 DIAGNOSIS — Z0001 Encounter for general adult medical examination with abnormal findings: Secondary | ICD-10-CM | POA: Diagnosis not present

## 2022-04-22 DIAGNOSIS — I2510 Atherosclerotic heart disease of native coronary artery without angina pectoris: Secondary | ICD-10-CM | POA: Diagnosis not present

## 2022-05-23 DIAGNOSIS — M810 Age-related osteoporosis without current pathological fracture: Secondary | ICD-10-CM | POA: Diagnosis not present

## 2022-05-23 DIAGNOSIS — M5442 Lumbago with sciatica, left side: Secondary | ICD-10-CM | POA: Diagnosis not present

## 2022-05-23 DIAGNOSIS — G8929 Other chronic pain: Secondary | ICD-10-CM | POA: Diagnosis not present

## 2022-05-25 DIAGNOSIS — M810 Age-related osteoporosis without current pathological fracture: Secondary | ICD-10-CM | POA: Diagnosis not present

## 2022-05-30 DIAGNOSIS — M5442 Lumbago with sciatica, left side: Secondary | ICD-10-CM | POA: Diagnosis not present

## 2022-06-28 DIAGNOSIS — R82994 Hypercalciuria: Secondary | ICD-10-CM | POA: Diagnosis not present

## 2022-07-12 DIAGNOSIS — L820 Inflamed seborrheic keratosis: Secondary | ICD-10-CM | POA: Diagnosis not present

## 2022-07-12 DIAGNOSIS — L111 Transient acantholytic dermatosis [Grover]: Secondary | ICD-10-CM | POA: Diagnosis not present

## 2022-07-12 DIAGNOSIS — L570 Actinic keratosis: Secondary | ICD-10-CM | POA: Diagnosis not present

## 2022-07-12 DIAGNOSIS — L814 Other melanin hyperpigmentation: Secondary | ICD-10-CM | POA: Diagnosis not present

## 2022-07-12 DIAGNOSIS — L821 Other seborrheic keratosis: Secondary | ICD-10-CM | POA: Diagnosis not present

## 2022-07-12 DIAGNOSIS — Z85828 Personal history of other malignant neoplasm of skin: Secondary | ICD-10-CM | POA: Diagnosis not present

## 2022-07-13 DIAGNOSIS — R82994 Hypercalciuria: Secondary | ICD-10-CM | POA: Diagnosis not present

## 2022-07-18 DIAGNOSIS — Z961 Presence of intraocular lens: Secondary | ICD-10-CM | POA: Diagnosis not present

## 2022-09-20 DIAGNOSIS — M9903 Segmental and somatic dysfunction of lumbar region: Secondary | ICD-10-CM | POA: Diagnosis not present

## 2022-09-20 DIAGNOSIS — M62830 Muscle spasm of back: Secondary | ICD-10-CM | POA: Diagnosis not present

## 2022-09-20 DIAGNOSIS — M47816 Spondylosis without myelopathy or radiculopathy, lumbar region: Secondary | ICD-10-CM | POA: Diagnosis not present

## 2022-09-20 DIAGNOSIS — S161XXA Strain of muscle, fascia and tendon at neck level, initial encounter: Secondary | ICD-10-CM | POA: Diagnosis not present

## 2022-09-22 DIAGNOSIS — M62830 Muscle spasm of back: Secondary | ICD-10-CM | POA: Diagnosis not present

## 2022-09-22 DIAGNOSIS — M9903 Segmental and somatic dysfunction of lumbar region: Secondary | ICD-10-CM | POA: Diagnosis not present

## 2022-09-22 DIAGNOSIS — S161XXA Strain of muscle, fascia and tendon at neck level, initial encounter: Secondary | ICD-10-CM | POA: Diagnosis not present

## 2022-09-22 DIAGNOSIS — M47816 Spondylosis without myelopathy or radiculopathy, lumbar region: Secondary | ICD-10-CM | POA: Diagnosis not present

## 2022-09-27 DIAGNOSIS — M47816 Spondylosis without myelopathy or radiculopathy, lumbar region: Secondary | ICD-10-CM | POA: Diagnosis not present

## 2022-09-27 DIAGNOSIS — S161XXA Strain of muscle, fascia and tendon at neck level, initial encounter: Secondary | ICD-10-CM | POA: Diagnosis not present

## 2022-09-27 DIAGNOSIS — M62830 Muscle spasm of back: Secondary | ICD-10-CM | POA: Diagnosis not present

## 2022-09-27 DIAGNOSIS — M9903 Segmental and somatic dysfunction of lumbar region: Secondary | ICD-10-CM | POA: Diagnosis not present

## 2022-09-29 DIAGNOSIS — M47816 Spondylosis without myelopathy or radiculopathy, lumbar region: Secondary | ICD-10-CM | POA: Diagnosis not present

## 2022-09-29 DIAGNOSIS — M9903 Segmental and somatic dysfunction of lumbar region: Secondary | ICD-10-CM | POA: Diagnosis not present

## 2022-09-29 DIAGNOSIS — S161XXA Strain of muscle, fascia and tendon at neck level, initial encounter: Secondary | ICD-10-CM | POA: Diagnosis not present

## 2022-09-29 DIAGNOSIS — M62830 Muscle spasm of back: Secondary | ICD-10-CM | POA: Diagnosis not present

## 2022-10-04 DIAGNOSIS — S161XXA Strain of muscle, fascia and tendon at neck level, initial encounter: Secondary | ICD-10-CM | POA: Diagnosis not present

## 2022-10-04 DIAGNOSIS — M62830 Muscle spasm of back: Secondary | ICD-10-CM | POA: Diagnosis not present

## 2022-10-04 DIAGNOSIS — M47816 Spondylosis without myelopathy or radiculopathy, lumbar region: Secondary | ICD-10-CM | POA: Diagnosis not present

## 2022-10-04 DIAGNOSIS — M9903 Segmental and somatic dysfunction of lumbar region: Secondary | ICD-10-CM | POA: Diagnosis not present

## 2022-10-11 DIAGNOSIS — M9903 Segmental and somatic dysfunction of lumbar region: Secondary | ICD-10-CM | POA: Diagnosis not present

## 2022-10-11 DIAGNOSIS — S161XXA Strain of muscle, fascia and tendon at neck level, initial encounter: Secondary | ICD-10-CM | POA: Diagnosis not present

## 2022-10-11 DIAGNOSIS — M62830 Muscle spasm of back: Secondary | ICD-10-CM | POA: Diagnosis not present

## 2022-10-11 DIAGNOSIS — M47816 Spondylosis without myelopathy or radiculopathy, lumbar region: Secondary | ICD-10-CM | POA: Diagnosis not present

## 2022-10-13 DIAGNOSIS — M47816 Spondylosis without myelopathy or radiculopathy, lumbar region: Secondary | ICD-10-CM | POA: Diagnosis not present

## 2022-10-13 DIAGNOSIS — M62830 Muscle spasm of back: Secondary | ICD-10-CM | POA: Diagnosis not present

## 2022-10-13 DIAGNOSIS — M9903 Segmental and somatic dysfunction of lumbar region: Secondary | ICD-10-CM | POA: Diagnosis not present

## 2022-10-13 DIAGNOSIS — S161XXA Strain of muscle, fascia and tendon at neck level, initial encounter: Secondary | ICD-10-CM | POA: Diagnosis not present

## 2022-10-18 DIAGNOSIS — S161XXA Strain of muscle, fascia and tendon at neck level, initial encounter: Secondary | ICD-10-CM | POA: Diagnosis not present

## 2022-10-18 DIAGNOSIS — M62830 Muscle spasm of back: Secondary | ICD-10-CM | POA: Diagnosis not present

## 2022-10-18 DIAGNOSIS — M47816 Spondylosis without myelopathy or radiculopathy, lumbar region: Secondary | ICD-10-CM | POA: Diagnosis not present

## 2022-10-18 DIAGNOSIS — M9903 Segmental and somatic dysfunction of lumbar region: Secondary | ICD-10-CM | POA: Diagnosis not present

## 2022-10-25 DIAGNOSIS — M9903 Segmental and somatic dysfunction of lumbar region: Secondary | ICD-10-CM | POA: Diagnosis not present

## 2022-10-25 DIAGNOSIS — M47816 Spondylosis without myelopathy or radiculopathy, lumbar region: Secondary | ICD-10-CM | POA: Diagnosis not present

## 2022-10-25 DIAGNOSIS — S161XXA Strain of muscle, fascia and tendon at neck level, initial encounter: Secondary | ICD-10-CM | POA: Diagnosis not present

## 2022-10-25 DIAGNOSIS — M62830 Muscle spasm of back: Secondary | ICD-10-CM | POA: Diagnosis not present

## 2022-11-08 DIAGNOSIS — M47816 Spondylosis without myelopathy or radiculopathy, lumbar region: Secondary | ICD-10-CM | POA: Diagnosis not present

## 2022-11-08 DIAGNOSIS — M9903 Segmental and somatic dysfunction of lumbar region: Secondary | ICD-10-CM | POA: Diagnosis not present

## 2022-11-08 DIAGNOSIS — M62830 Muscle spasm of back: Secondary | ICD-10-CM | POA: Diagnosis not present

## 2022-11-08 DIAGNOSIS — S161XXA Strain of muscle, fascia and tendon at neck level, initial encounter: Secondary | ICD-10-CM | POA: Diagnosis not present

## 2022-11-15 DIAGNOSIS — S161XXA Strain of muscle, fascia and tendon at neck level, initial encounter: Secondary | ICD-10-CM | POA: Diagnosis not present

## 2022-11-15 DIAGNOSIS — M47816 Spondylosis without myelopathy or radiculopathy, lumbar region: Secondary | ICD-10-CM | POA: Diagnosis not present

## 2022-11-15 DIAGNOSIS — M62830 Muscle spasm of back: Secondary | ICD-10-CM | POA: Diagnosis not present

## 2022-11-15 DIAGNOSIS — M9903 Segmental and somatic dysfunction of lumbar region: Secondary | ICD-10-CM | POA: Diagnosis not present

## 2022-11-22 DIAGNOSIS — M62830 Muscle spasm of back: Secondary | ICD-10-CM | POA: Diagnosis not present

## 2022-11-22 DIAGNOSIS — S161XXA Strain of muscle, fascia and tendon at neck level, initial encounter: Secondary | ICD-10-CM | POA: Diagnosis not present

## 2022-11-22 DIAGNOSIS — M47816 Spondylosis without myelopathy or radiculopathy, lumbar region: Secondary | ICD-10-CM | POA: Diagnosis not present

## 2022-11-22 DIAGNOSIS — M9903 Segmental and somatic dysfunction of lumbar region: Secondary | ICD-10-CM | POA: Diagnosis not present

## 2023-04-27 DIAGNOSIS — Z0000 Encounter for general adult medical examination without abnormal findings: Secondary | ICD-10-CM | POA: Diagnosis not present

## 2023-04-27 DIAGNOSIS — Z125 Encounter for screening for malignant neoplasm of prostate: Secondary | ICD-10-CM | POA: Diagnosis not present

## 2023-05-01 DIAGNOSIS — F329 Major depressive disorder, single episode, unspecified: Secondary | ICD-10-CM | POA: Diagnosis not present

## 2023-05-01 DIAGNOSIS — J309 Allergic rhinitis, unspecified: Secondary | ICD-10-CM | POA: Diagnosis not present

## 2023-05-01 DIAGNOSIS — N529 Male erectile dysfunction, unspecified: Secondary | ICD-10-CM | POA: Diagnosis not present

## 2023-05-01 DIAGNOSIS — R809 Proteinuria, unspecified: Secondary | ICD-10-CM | POA: Diagnosis not present

## 2023-05-01 DIAGNOSIS — Z0000 Encounter for general adult medical examination without abnormal findings: Secondary | ICD-10-CM | POA: Diagnosis not present

## 2023-05-01 DIAGNOSIS — I2510 Atherosclerotic heart disease of native coronary artery without angina pectoris: Secondary | ICD-10-CM | POA: Diagnosis not present

## 2023-07-13 DIAGNOSIS — L814 Other melanin hyperpigmentation: Secondary | ICD-10-CM | POA: Diagnosis not present

## 2023-07-13 DIAGNOSIS — L821 Other seborrheic keratosis: Secondary | ICD-10-CM | POA: Diagnosis not present

## 2023-07-13 DIAGNOSIS — Z85828 Personal history of other malignant neoplasm of skin: Secondary | ICD-10-CM | POA: Diagnosis not present

## 2023-07-13 DIAGNOSIS — L570 Actinic keratosis: Secondary | ICD-10-CM | POA: Diagnosis not present

## 2023-07-13 DIAGNOSIS — C44619 Basal cell carcinoma of skin of left upper limb, including shoulder: Secondary | ICD-10-CM | POA: Diagnosis not present

## 2024-01-10 DIAGNOSIS — L814 Other melanin hyperpigmentation: Secondary | ICD-10-CM | POA: Diagnosis not present

## 2024-01-10 DIAGNOSIS — D225 Melanocytic nevi of trunk: Secondary | ICD-10-CM | POA: Diagnosis not present

## 2024-01-10 DIAGNOSIS — Z85828 Personal history of other malignant neoplasm of skin: Secondary | ICD-10-CM | POA: Diagnosis not present

## 2024-01-10 DIAGNOSIS — C44619 Basal cell carcinoma of skin of left upper limb, including shoulder: Secondary | ICD-10-CM | POA: Diagnosis not present

## 2024-01-10 DIAGNOSIS — L570 Actinic keratosis: Secondary | ICD-10-CM | POA: Diagnosis not present

## 2024-01-10 DIAGNOSIS — L821 Other seborrheic keratosis: Secondary | ICD-10-CM | POA: Diagnosis not present

## 2024-01-10 DIAGNOSIS — C44712 Basal cell carcinoma of skin of right lower limb, including hip: Secondary | ICD-10-CM | POA: Diagnosis not present

## 2024-02-23 DIAGNOSIS — Z961 Presence of intraocular lens: Secondary | ICD-10-CM | POA: Diagnosis not present

## 2024-02-23 DIAGNOSIS — H04123 Dry eye syndrome of bilateral lacrimal glands: Secondary | ICD-10-CM | POA: Diagnosis not present

## 2024-02-23 DIAGNOSIS — H524 Presbyopia: Secondary | ICD-10-CM | POA: Diagnosis not present

## 2024-04-29 DIAGNOSIS — Z125 Encounter for screening for malignant neoplasm of prostate: Secondary | ICD-10-CM | POA: Diagnosis not present

## 2024-04-29 DIAGNOSIS — Z0000 Encounter for general adult medical examination without abnormal findings: Secondary | ICD-10-CM | POA: Diagnosis not present
